# Patient Record
Sex: Male | Born: 1970 | Race: White | Hispanic: No | Marital: Married | Smoking: Never smoker
Health system: Southern US, Community
[De-identification: ages and names within clinical notes are randomized; demographics above are authoritative.]

## PROBLEM LIST (undated history)

## (undated) DIAGNOSIS — N200 Calculus of kidney: Secondary | ICD-10-CM

## (undated) DIAGNOSIS — Z87442 Personal history of urinary calculi: Secondary | ICD-10-CM

## (undated) DIAGNOSIS — G4730 Sleep apnea, unspecified: Secondary | ICD-10-CM

## (undated) HISTORY — PX: APPENDECTOMY: SHX54

## (undated) HISTORY — PX: BACK SURGERY: SHX140

---

## 2000-12-15 ENCOUNTER — Emergency Department (HOSPITAL_COMMUNITY): Admission: EM | Admit: 2000-12-15 | Discharge: 2000-12-15 | Payer: Self-pay | Admitting: Emergency Medicine

## 2001-02-04 ENCOUNTER — Encounter: Payer: Self-pay | Admitting: Neurosurgery

## 2001-02-04 ENCOUNTER — Encounter: Admission: RE | Admit: 2001-02-04 | Discharge: 2001-02-04 | Payer: Self-pay | Admitting: Neurosurgery

## 2001-02-18 ENCOUNTER — Encounter: Payer: Self-pay | Admitting: Neurosurgery

## 2001-02-18 ENCOUNTER — Inpatient Hospital Stay (HOSPITAL_COMMUNITY): Admission: RE | Admit: 2001-02-18 | Discharge: 2001-02-19 | Payer: Self-pay | Admitting: Neurosurgery

## 2002-09-22 ENCOUNTER — Encounter: Payer: Self-pay | Admitting: Neurosurgery

## 2002-09-23 ENCOUNTER — Encounter: Payer: Self-pay | Admitting: Neurosurgery

## 2002-09-23 ENCOUNTER — Inpatient Hospital Stay (HOSPITAL_COMMUNITY): Admission: RE | Admit: 2002-09-23 | Discharge: 2002-09-26 | Payer: Self-pay | Admitting: Neurosurgery

## 2003-01-28 ENCOUNTER — Encounter (HOSPITAL_COMMUNITY): Admission: RE | Admit: 2003-01-28 | Discharge: 2003-02-27 | Payer: Self-pay | Admitting: Neurosurgery

## 2003-03-01 ENCOUNTER — Encounter (HOSPITAL_COMMUNITY): Admission: RE | Admit: 2003-03-01 | Discharge: 2003-03-31 | Payer: Self-pay | Admitting: Neurosurgery

## 2014-08-22 ENCOUNTER — Emergency Department (HOSPITAL_COMMUNITY)
Admission: EM | Admit: 2014-08-22 | Discharge: 2014-08-22 | Disposition: A | Discharge status: Home / Self Care | Payer: BLUE CROSS/BLUE SHIELD | Attending: Emergency Medicine | Admitting: Emergency Medicine

## 2014-08-22 ENCOUNTER — Emergency Department (HOSPITAL_COMMUNITY): Payer: BLUE CROSS/BLUE SHIELD

## 2014-08-22 ENCOUNTER — Encounter (HOSPITAL_COMMUNITY): Payer: Self-pay | Admitting: Emergency Medicine

## 2014-08-22 DIAGNOSIS — N201 Calculus of ureter: Secondary | ICD-10-CM

## 2014-08-22 DIAGNOSIS — N23 Unspecified renal colic: Secondary | ICD-10-CM | POA: Diagnosis not present

## 2014-08-22 DIAGNOSIS — N202 Calculus of kidney with calculus of ureter: Secondary | ICD-10-CM | POA: Insufficient documentation

## 2014-08-22 DIAGNOSIS — R52 Pain, unspecified: Secondary | ICD-10-CM

## 2014-08-22 DIAGNOSIS — Z79899 Other long term (current) drug therapy: Secondary | ICD-10-CM | POA: Diagnosis not present

## 2014-08-22 DIAGNOSIS — N200 Calculus of kidney: Secondary | ICD-10-CM

## 2014-08-22 DIAGNOSIS — R109 Unspecified abdominal pain: Secondary | ICD-10-CM | POA: Diagnosis present

## 2014-08-22 HISTORY — DX: Calculus of kidney: N20.0

## 2014-08-22 LAB — CBC WITH DIFFERENTIAL/PLATELET
Basophils Absolute: 0 10*3/uL (ref 0.0–0.1)
Basophils Relative: 0 % (ref 0–1)
Eosinophils Absolute: 0.1 10*3/uL (ref 0.0–0.7)
Eosinophils Relative: 1 % (ref 0–5)
HCT: 44.1 % (ref 39.0–52.0)
Hemoglobin: 14.8 g/dL (ref 13.0–17.0)
Lymphocytes Relative: 30 % (ref 12–46)
Lymphs Abs: 2.7 10*3/uL (ref 0.7–4.0)
MCH: 30.1 pg (ref 26.0–34.0)
MCHC: 33.6 g/dL (ref 30.0–36.0)
MCV: 89.6 fL (ref 78.0–100.0)
Monocytes Absolute: 0.7 10*3/uL (ref 0.1–1.0)
Monocytes Relative: 8 % (ref 3–12)
Neutro Abs: 5.4 10*3/uL (ref 1.7–7.7)
Neutrophils Relative %: 61 % (ref 43–77)
Platelets: 179 10*3/uL (ref 150–400)
RBC: 4.92 MIL/uL (ref 4.22–5.81)
RDW: 13.2 % (ref 11.5–15.5)
WBC: 8.9 10*3/uL (ref 4.0–10.5)

## 2014-08-22 LAB — BASIC METABOLIC PANEL
ANION GAP: 9 (ref 5–15)
BUN: 27 mg/dL — ABNORMAL HIGH (ref 6–20)
CO2: 26 mmol/L (ref 22–32)
CREATININE: 1.63 mg/dL — AB (ref 0.61–1.24)
Calcium: 9 mg/dL (ref 8.9–10.3)
Chloride: 105 mmol/L (ref 101–111)
GFR calc Af Amer: 57 mL/min — ABNORMAL LOW (ref >60)
GFR calc non Af Amer: 49 mL/min — ABNORMAL LOW (ref >60)
GLUCOSE: 123 mg/dL — AB (ref 65–99)
Potassium: 3.7 mmol/L (ref 3.5–5.1)
Sodium: 140 mmol/L (ref 135–145)

## 2014-08-22 LAB — URINALYSIS, ROUTINE W REFLEX MICROSCOPIC
Glucose, UA: NEGATIVE mg/dL
Ketones, ur: NEGATIVE mg/dL
Leukocytes, UA: NEGATIVE
Nitrite: NEGATIVE
Specific Gravity, Urine: >1.03 — ABNORMAL HIGH (ref 1.005–1.030)
Urobilinogen, UA: 0.2 mg/dL (ref 0.0–1.0)
pH: 5.5 (ref 5.0–8.0)

## 2014-08-22 LAB — URINE MICROSCOPIC-ADD ON

## 2014-08-22 MED ORDER — TAMSULOSIN HCL 0.4 MG PO CAPS: 0.4000 mg | ORAL_CAPSULE | Freq: Every day | ORAL | Status: DC

## 2014-08-22 MED ORDER — HYDROCODONE-ACETAMINOPHEN 5-325 MG PO TABS: 2.0000 | ORAL_TABLET | ORAL | Status: DC | PRN

## 2014-08-22 MED ORDER — HYDROCODONE-ACETAMINOPHEN 5-325 MG PO TABS
1.0000 | ORAL_TABLET | Freq: Four times a day (QID) | ORAL | Status: DC | PRN
Start: 2014-08-22 — End: 2022-04-05

## 2014-08-22 MED ORDER — KETOROLAC TROMETHAMINE 30 MG/ML IJ SOLN
30.0000 mg | Freq: Once | INTRAMUSCULAR | Status: AC
  Administered 2014-08-22: 30 mg via INTRAVENOUS
  Filled 2014-08-22: qty 1

## 2014-08-22 MED ORDER — MORPHINE SULFATE 4 MG/ML IJ SOLN
8.0000 mg | Freq: Once | INTRAMUSCULAR | Status: AC
  Administered 2014-08-22: 8 mg via INTRAVENOUS
  Filled 2014-08-22: qty 2

## 2014-08-22 MED ORDER — OXYCODONE-ACETAMINOPHEN 5-325 MG PO TABS: 2.0000 | ORAL_TABLET | ORAL | Status: DC | PRN

## 2014-08-22 MED ORDER — ONDANSETRON HCL 4 MG/2ML IJ SOLN
4.0000 mg | Freq: Once | INTRAMUSCULAR | Status: AC
  Administered 2014-08-22: 4 mg via INTRAVENOUS
  Filled 2014-08-22: qty 2

## 2014-08-22 NOTE — ED Notes (Signed)
Pt. C/o right flank pain. Pt. Reports nausea. Pt. Reports hx of kidney stones.

## 2014-08-22 NOTE — Discharge Instructions (Signed)

## 2014-08-22 NOTE — ED Provider Notes (Signed)
CSN: [id number]     Arrival date & time 08/22/14  2019 History   First MD Initiated Contact with Patient 08/22/14 2031     Chief Complaint  Patient presents with  . Flank Pain     (Consider location/radiation/quality/duration/timing/severity/associated sxs/prior Treatment) Patient is a 44 y.o. male presenting with flank pain. The history is provided by the patient.  Flank Pain This is a new problem. The current episode started 3 to 5 hours ago. The problem occurs constantly. The problem has been rapidly worsening. Nothing aggravates the symptoms. Nothing relieves the symptoms. He has tried nothing for the symptoms. The treatment provided no relief.    Past Medical History  Diagnosis Date  . Kidney stones    Past Surgical History  Procedure Laterality Date  . Back surgery     No family history on file. History  Substance Use Topics  . Smoking status: Never Smoker   . Smokeless tobacco: Not on file  . Alcohol Use: No    Review of Systems  Genitourinary: Positive for flank pain.  All other systems reviewed and are negative.     Allergies  Review of patient's allergies indicates no known allergies.  Home Medications   Prior to Admission medications   Medication Sig Start Date End Date Taking? Authorizing Provider  ibuprofen (ADVIL,MOTRIN) 200 MG tablet Take 600 mg by mouth every 6 (six) hours as needed for mild pain.   Yes Historical Provider, MD  HYDROcodone-acetaminophen (NORCO/VICODIN) 5-325 MG per tablet Take 1 tablet by mouth every 6 (six) hours as needed for moderate pain. 08/22/14   Lyndal Pulley, MD  tamsulosin (FLOMAX) 0.4 MG CAPS capsule Take 1 capsule (0.4 mg total) by mouth daily. 08/22/14   Lyndal Pulley, MD   BP 144/86 mmHg  Pulse 70  Temp(Src) 97.9 F (36.6 C) (Oral)  Resp 24  Ht  (1.803 m)  Wt 270 lb (122.471 kg)  BMI 37.67 kg/m2  SpO2 96% Physical Exam  Constitutional: He is oriented to person, place, and time. He appears well-developed and  well-nourished. No distress.  HENT:  Head: Normocephalic and atraumatic.  Eyes: Conjunctivae are normal.  Neck: Neck supple. No tracheal deviation present.  Cardiovascular: Normal rate and regular rhythm.   Pulmonary/Chest: Effort normal. No respiratory distress.  Abdominal: Soft. He exhibits no distension and no mass. There is tenderness (in RLQ, right flank, right CVA). There is no rebound and no guarding.  Neurological: He is alert and oriented to person, place, and time.  Skin: Skin is warm and dry.  Psychiatric: He has a normal mood and affect.    ED Course  Procedures (including critical care time) Labs Review Labs Reviewed  URINALYSIS, ROUTINE W REFLEX MICROSCOPIC (NOT AT [facility]) - Abnormal; Notable for the following:    Specific Gravity, Urine >1.030 (*)    Hgb urine dipstick LARGE (*)    Bilirubin Urine SMALL (*)    Protein, ur TRACE (*)    All other components within normal limits  BASIC METABOLIC PANEL - Abnormal; Notable for the following:    Glucose, Bld 123 (*)    BUN 27 (*)    Creatinine, Ser 1.63 (*)    GFR calc non Af Amer 49 (*)    GFR calc Af Amer 57 (*)    All other components within normal limits  CBC WITH DIFFERENTIAL/PLATELET  URINE MICROSCOPIC-ADD ON   Imaging Review Ct Renal Stone Study  08/22/2014   CLINICAL DATA:  Sharp constant right flank pain  and nausea. History of kidney stones.  EXAM: CT ABDOMEN AND PELVIS WITHOUT CONTRAST  TECHNIQUE: Multidetector CT imaging of the abdomen and pelvis was performed following the standard protocol without IV contrast.  COMPARISON:  None.  FINDINGS: Lung bases demonstrate mild linear atelectasis over the lingula.  Abdominal images demonstrate a normal liver, spleen, pancreas, gallbladder and adrenal glands. Appendix is not well visualized.  Kidneys are normal in size. There are no renal stones identified. There is mild dilatation of the right intrarenal collecting system. There is no perinephric inflammation or fluid.  There is a 2-3 mm stone just inside the bladder at the right UVJ causing low-grade obstruction. Left ureter is normal.  Colon and small bowel are within normal. Mesentery is normal. There is no free fluid.  Abdominal aorta is normal.  Remaining pelvic images demonstrate a normal prostate and rectum. There mild degenerate changes of the spine. Intervertebral cages present at the L4-5 level.  IMPRESSION: 2-3 mm stone just inside the bladder at the right UVJ causing low-grade obstruction.   Electronically Signed   By: Elberta Fortis M.D.   On: 08/22/2014 21:55   I independently viewed and interpreted the above radiology studies and agree with radiologist report.    EKG Interpretation None      MDM   Final diagnoses:  Kidney stone on right side  Renal colic on right side  Obstruction of right ureteropelvic junction due to stone    44 year old male presents with 2 hours of sudden onset right flank pain radiating to the right groin that he has had previously and self diagnosed with kidney stones that usually resolve spontaneously by passing. Today his pain is much worse than his typical episode and he was incapacitated. He has never had prior imaging to evaluate for stones. He does have a slight elevation in his creatinine but stone in his distal and he is not bilaterally obstructed or infected. Single 3 mm distal stone noted on CT unaffected side. I recommended follow-up with urology as needed and provided pain relief and expulsion therapy for home.    Lyndal Pulley, MD 08/22/14 2213

## 2015-12-19 DIAGNOSIS — R7301 Impaired fasting glucose: Secondary | ICD-10-CM | POA: Diagnosis not present

## 2015-12-19 DIAGNOSIS — R229 Localized swelling, mass and lump, unspecified: Secondary | ICD-10-CM | POA: Diagnosis not present

## 2016-03-19 DIAGNOSIS — R7301 Impaired fasting glucose: Secondary | ICD-10-CM | POA: Diagnosis not present

## 2016-03-19 DIAGNOSIS — R030 Elevated blood-pressure reading, without diagnosis of hypertension: Secondary | ICD-10-CM | POA: Diagnosis not present

## 2016-03-19 DIAGNOSIS — Z008 Encounter for other general examination: Secondary | ICD-10-CM | POA: Diagnosis not present

## 2016-04-06 IMAGING — CT CT RENAL STONE PROTOCOL
2 of 4 series · 17 of 46 positions shown, 19 images · non-contrast
Comparison: None.

CLINICAL DATA: Sharp constant right flank pain and nausea. History
of kidney stones.

EXAM:
CT ABDOMEN AND PELVIS WITHOUT CONTRAST
TECHNIQUE: Multidetector CT imaging of the abdomen and pelvis was performed
following the standard protocol without IV contrast.

[Series 2: standard/full over (age)lbs 5.0 · axial · 0.75mm/px · z∈[+390,+885]mm · 14 of 109 slices shown, 16 images]
[im 5/109  soft-tissue]
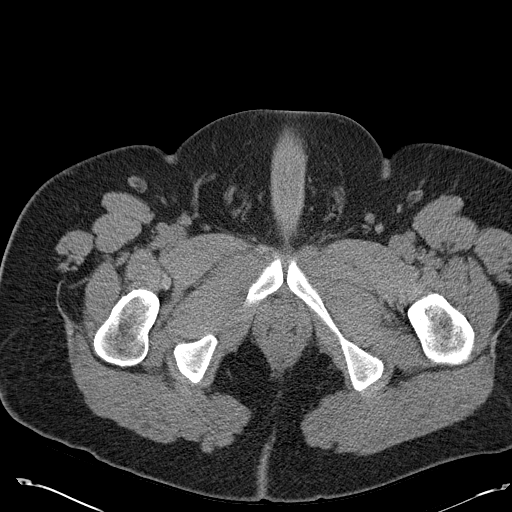
[im 5/109  bone]
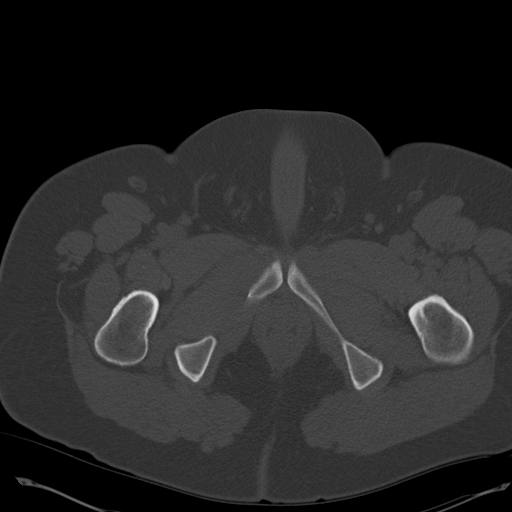
[im 15/109  soft-tissue]
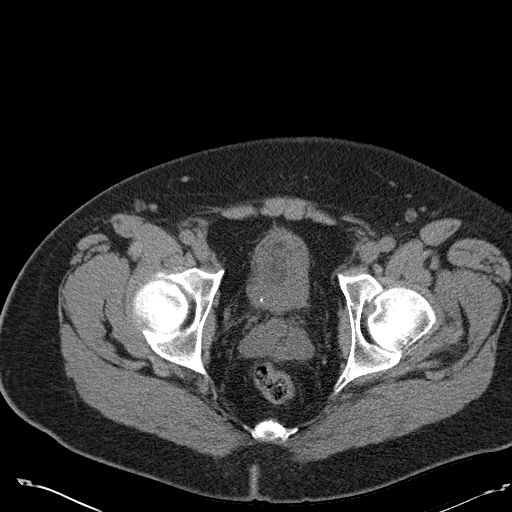
[im 19/109  soft-tissue]
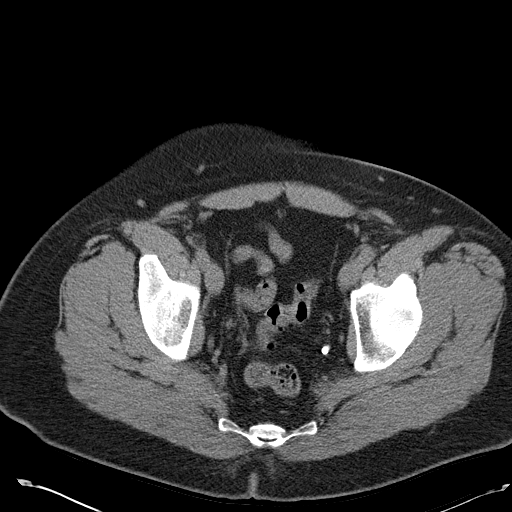
[im 29/109  soft-tissue]
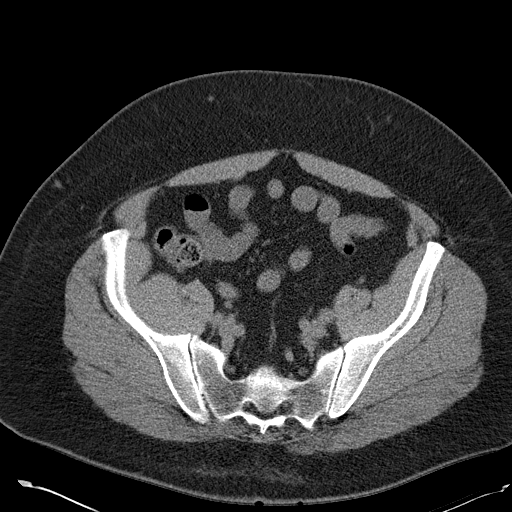
[im 38/109  soft-tissue]
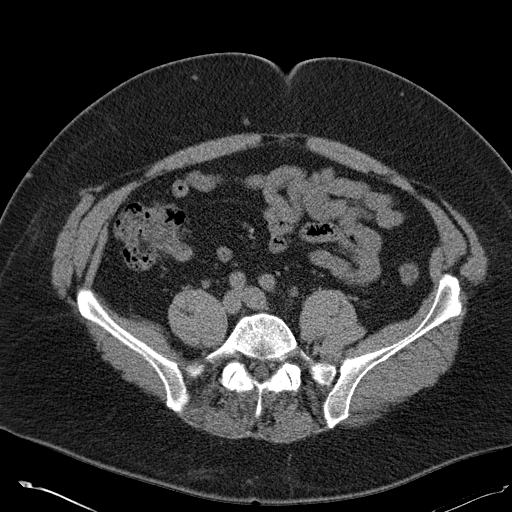
[im 43/109  soft-tissue]
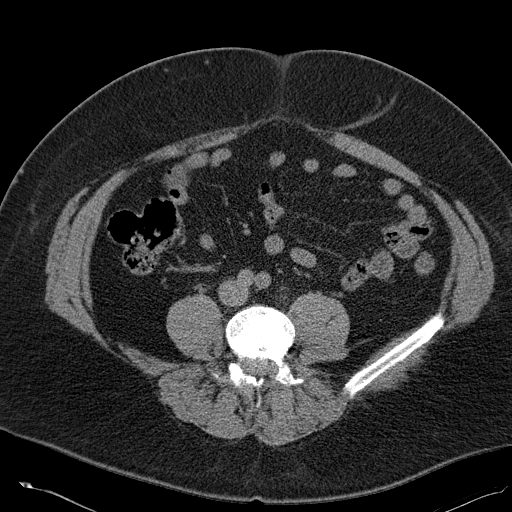
[im 52/109  soft-tissue]
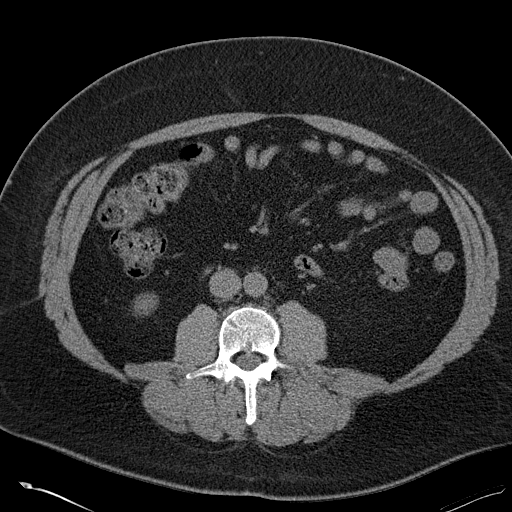
[im 57/109  soft-tissue]
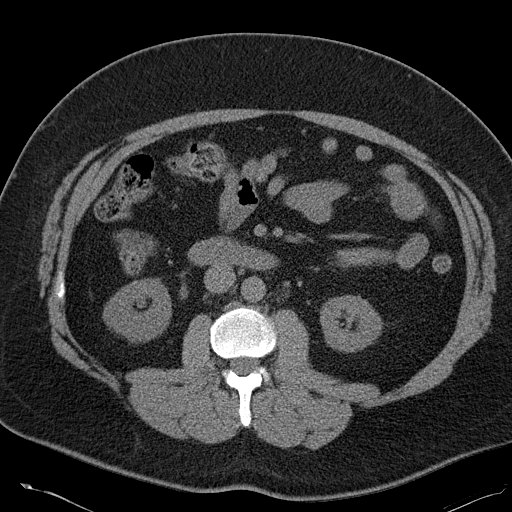
[im 66/109  soft-tissue]
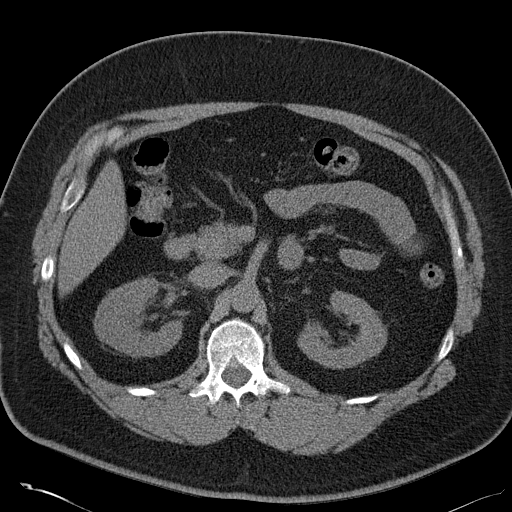
[im 66/109  bone]
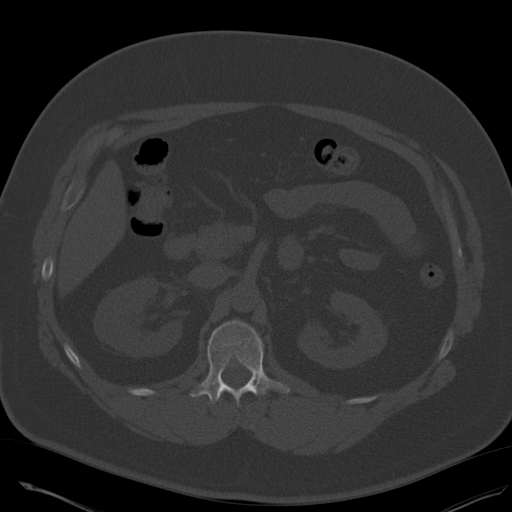
[im 71/109  soft-tissue]
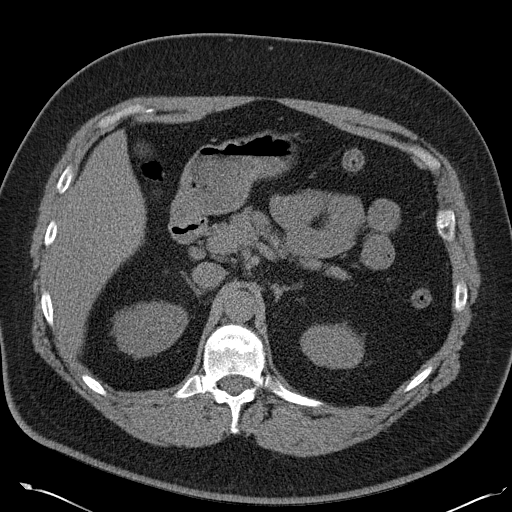
[im 80/109  soft-tissue]
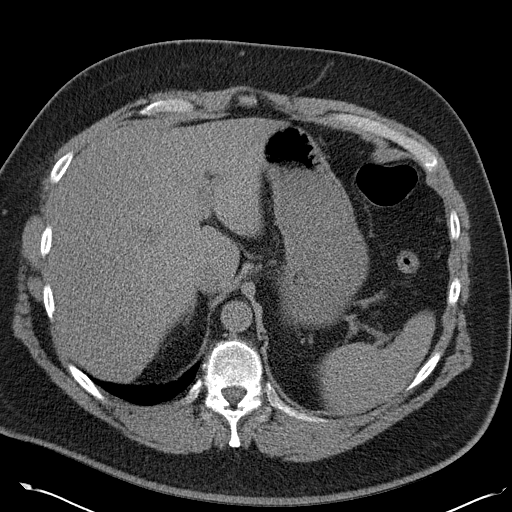
[im 90/109  soft-tissue]
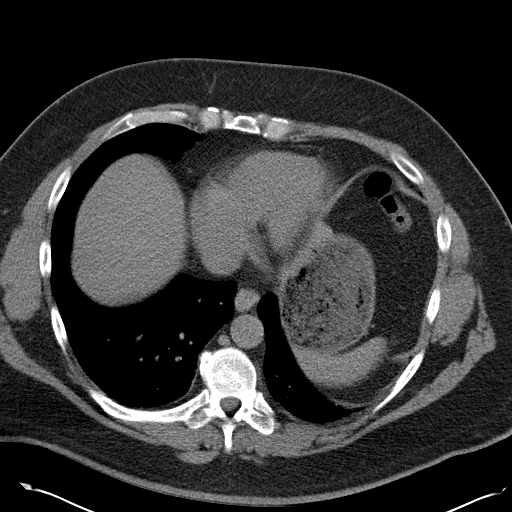
[im 94/109  soft-tissue]
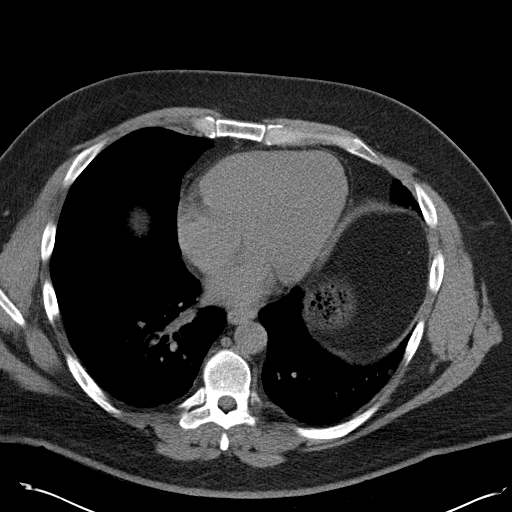
[im 104/109  soft-tissue]
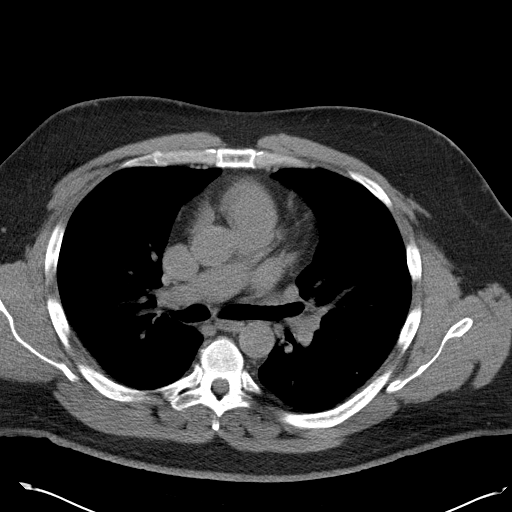

[Series 3: mpr coronal · coronal · 0.95mm/px · 3 of 129 slices shown]
[im 43/129  soft-tissue]
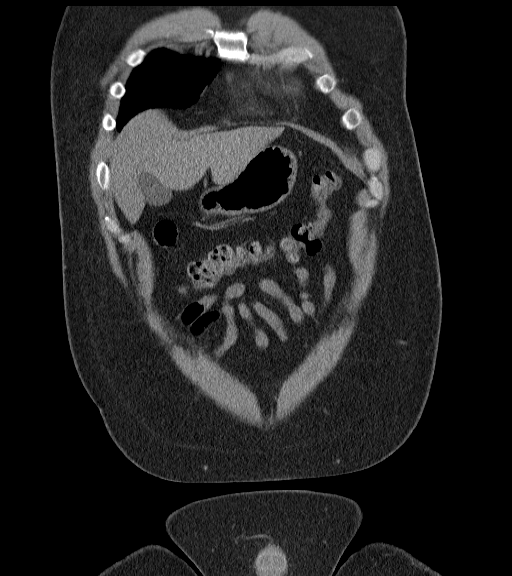
[im 57/129  soft-tissue]
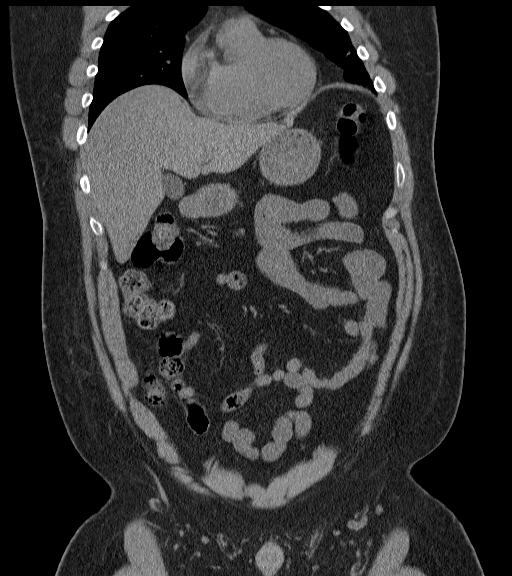
[im 72/129  soft-tissue]
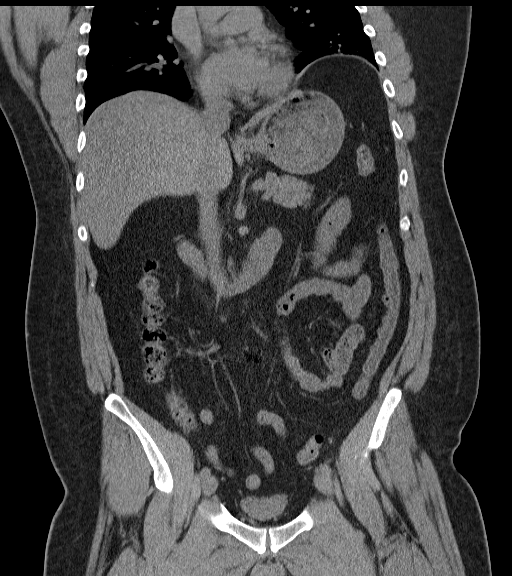

[17 of 46 positions shown; findings below may reference images not displayed]

FINDINGS: Lung bases demonstrate mild linear atelectasis over the lingula.

Abdominal images demonstrate a normal liver, spleen, pancreas,
gallbladder and adrenal glands. Appendix is not well visualized.

Kidneys are normal in size. There are no renal stones identified.
There is mild dilatation of the right intrarenal collecting system.
There is no perinephric inflammation or fluid. There is a 2-3 mm
stone just inside the bladder at the right UVJ causing low-grade
obstruction. Left ureter is normal.

Colon and small bowel are within normal. Mesentery is normal. There
is no free fluid.

Abdominal aorta is normal.

Remaining pelvic images demonstrate a normal prostate and rectum.
There mild degenerate changes of the spine. Intervertebral cages
present at the L4-5 level.
IMPRESSION: 2-3 mm stone just inside the bladder at the right UVJ causing
low-grade obstruction.

## 2016-06-11 DIAGNOSIS — Z008 Encounter for other general examination: Secondary | ICD-10-CM | POA: Diagnosis not present

## 2016-08-25 DIAGNOSIS — Z6841 Body Mass Index (BMI) 40.0 and over, adult: Secondary | ICD-10-CM | POA: Diagnosis not present

## 2016-08-25 DIAGNOSIS — J0100 Acute maxillary sinusitis, unspecified: Secondary | ICD-10-CM | POA: Diagnosis not present

## 2016-08-31 DIAGNOSIS — Z008 Encounter for other general examination: Secondary | ICD-10-CM | POA: Diagnosis not present

## 2016-08-31 DIAGNOSIS — R7301 Impaired fasting glucose: Secondary | ICD-10-CM | POA: Diagnosis not present

## 2016-11-03 DIAGNOSIS — J019 Acute sinusitis, unspecified: Secondary | ICD-10-CM | POA: Diagnosis not present

## 2016-11-03 DIAGNOSIS — R05 Cough: Secondary | ICD-10-CM | POA: Diagnosis not present

## 2016-11-03 DIAGNOSIS — Z6841 Body Mass Index (BMI) 40.0 and over, adult: Secondary | ICD-10-CM | POA: Diagnosis not present

## 2016-12-03 DIAGNOSIS — R739 Hyperglycemia, unspecified: Secondary | ICD-10-CM | POA: Diagnosis not present

## 2016-12-03 DIAGNOSIS — Z008 Encounter for other general examination: Secondary | ICD-10-CM | POA: Diagnosis not present

## 2017-06-10 DIAGNOSIS — M79642 Pain in left hand: Secondary | ICD-10-CM | POA: Diagnosis not present

## 2017-06-10 DIAGNOSIS — Z008 Encounter for other general examination: Secondary | ICD-10-CM | POA: Diagnosis not present

## 2017-06-10 DIAGNOSIS — R7301 Impaired fasting glucose: Secondary | ICD-10-CM | POA: Diagnosis not present

## 2017-09-30 DIAGNOSIS — Z008 Encounter for other general examination: Secondary | ICD-10-CM | POA: Diagnosis not present

## 2017-12-30 DIAGNOSIS — Z008 Encounter for other general examination: Secondary | ICD-10-CM | POA: Diagnosis not present

## 2018-03-31 DIAGNOSIS — Z008 Encounter for other general examination: Secondary | ICD-10-CM | POA: Diagnosis not present

## 2018-03-31 DIAGNOSIS — H10022 Other mucopurulent conjunctivitis, left eye: Secondary | ICD-10-CM | POA: Diagnosis not present

## 2018-03-31 DIAGNOSIS — R7301 Impaired fasting glucose: Secondary | ICD-10-CM | POA: Diagnosis not present

## 2018-07-25 DIAGNOSIS — R5383 Other fatigue: Secondary | ICD-10-CM | POA: Diagnosis not present

## 2018-07-25 DIAGNOSIS — R0683 Snoring: Secondary | ICD-10-CM | POA: Diagnosis not present

## 2018-07-25 DIAGNOSIS — H109 Unspecified conjunctivitis: Secondary | ICD-10-CM | POA: Diagnosis not present

## 2018-07-25 DIAGNOSIS — Z6841 Body Mass Index (BMI) 40.0 and over, adult: Secondary | ICD-10-CM | POA: Diagnosis not present

## 2018-08-30 DIAGNOSIS — G4733 Obstructive sleep apnea (adult) (pediatric): Secondary | ICD-10-CM | POA: Diagnosis not present

## 2018-08-30 DIAGNOSIS — R0683 Snoring: Secondary | ICD-10-CM | POA: Diagnosis not present

## 2018-09-05 DIAGNOSIS — H04123 Dry eye syndrome of bilateral lacrimal glands: Secondary | ICD-10-CM | POA: Diagnosis not present

## 2018-10-10 DIAGNOSIS — G4733 Obstructive sleep apnea (adult) (pediatric): Secondary | ICD-10-CM | POA: Diagnosis not present

## 2018-11-09 DIAGNOSIS — G4733 Obstructive sleep apnea (adult) (pediatric): Secondary | ICD-10-CM | POA: Diagnosis not present

## 2018-12-08 DIAGNOSIS — Z008 Encounter for other general examination: Secondary | ICD-10-CM | POA: Diagnosis not present

## 2018-12-10 DIAGNOSIS — G4733 Obstructive sleep apnea (adult) (pediatric): Secondary | ICD-10-CM | POA: Diagnosis not present

## 2019-01-09 DIAGNOSIS — G4733 Obstructive sleep apnea (adult) (pediatric): Secondary | ICD-10-CM | POA: Diagnosis not present

## 2019-02-09 DIAGNOSIS — G4733 Obstructive sleep apnea (adult) (pediatric): Secondary | ICD-10-CM | POA: Diagnosis not present

## 2019-03-12 DIAGNOSIS — G4733 Obstructive sleep apnea (adult) (pediatric): Secondary | ICD-10-CM | POA: Diagnosis not present

## 2019-04-09 DIAGNOSIS — G4733 Obstructive sleep apnea (adult) (pediatric): Secondary | ICD-10-CM | POA: Diagnosis not present

## 2019-04-10 ENCOUNTER — Ambulatory Visit: Payer: BLUE CROSS/BLUE SHIELD

## 2019-05-10 DIAGNOSIS — G4733 Obstructive sleep apnea (adult) (pediatric): Secondary | ICD-10-CM | POA: Diagnosis not present

## 2019-06-09 DIAGNOSIS — G4733 Obstructive sleep apnea (adult) (pediatric): Secondary | ICD-10-CM | POA: Diagnosis not present

## 2019-07-10 DIAGNOSIS — G4733 Obstructive sleep apnea (adult) (pediatric): Secondary | ICD-10-CM | POA: Diagnosis not present

## 2019-08-09 DIAGNOSIS — G4733 Obstructive sleep apnea (adult) (pediatric): Secondary | ICD-10-CM | POA: Diagnosis not present

## 2019-09-09 DIAGNOSIS — G4733 Obstructive sleep apnea (adult) (pediatric): Secondary | ICD-10-CM | POA: Diagnosis not present

## 2019-09-29 DIAGNOSIS — G4733 Obstructive sleep apnea (adult) (pediatric): Secondary | ICD-10-CM | POA: Diagnosis not present

## 2019-10-10 DIAGNOSIS — G4733 Obstructive sleep apnea (adult) (pediatric): Secondary | ICD-10-CM | POA: Diagnosis not present

## 2019-11-09 DIAGNOSIS — G4733 Obstructive sleep apnea (adult) (pediatric): Secondary | ICD-10-CM | POA: Diagnosis not present

## 2019-12-10 DIAGNOSIS — G4733 Obstructive sleep apnea (adult) (pediatric): Secondary | ICD-10-CM | POA: Diagnosis not present

## 2020-01-09 DIAGNOSIS — G4733 Obstructive sleep apnea (adult) (pediatric): Secondary | ICD-10-CM | POA: Diagnosis not present

## 2022-03-30 ENCOUNTER — Other Ambulatory Visit: Payer: Self-pay | Admitting: *Deleted

## 2022-03-30 DIAGNOSIS — K429 Umbilical hernia without obstruction or gangrene: Secondary | ICD-10-CM

## 2022-04-05 ENCOUNTER — Encounter: Payer: Self-pay | Admitting: General Surgery

## 2022-04-05 ENCOUNTER — Ambulatory Visit: Payer: BC Managed Care – PPO | Admitting: General Surgery

## 2022-04-05 VITALS — BP 166/96 | HR 60 | Temp 98.1°F | Resp 14 | Ht 71.0 in | Wt 307.0 lb

## 2022-04-05 DIAGNOSIS — K429 Umbilical hernia without obstruction or gangrene: Secondary | ICD-10-CM

## 2022-04-05 NOTE — Progress Notes (Signed)
Jack Long; [id number]; Apr 12, 1970   HPI Patient is a 52 year old white male who was referred to my care by Dr. Quintin Alto for evaluation and treatment of an umbilical hernia.  Patient has had an umbilical hernia for some time, but over the past few months, it has increased in size and is now causing him discomfort.  He does have migratory abdominal wall pain with straining.  He denies any nausea or vomiting. Past Medical History:  Diagnosis Date   Kidney stones     Past Surgical History:  Procedure Laterality Date   BACK SURGERY      History reviewed. No pertinent family history.  Current Outpatient Medications on File Prior to Visit  Medication Sig Dispense Refill   methylPREDNISolone (MEDROL DOSEPAK) 4 MG TBPK tablet Take 8 mg by mouth as directed. (Patient not taking: Reported on 04/05/2022)     No current facility-administered medications on file prior to visit.    No Known Allergies  Social History   Substance and Sexual Activity  Alcohol Use No    Social History   Tobacco Use  Smoking Status Never  Smokeless Tobacco Not on file    Review of Systems  Constitutional: Negative.   HENT: Negative.    Eyes: Negative.   Respiratory: Negative.    Cardiovascular: Negative.   Gastrointestinal:  Positive for abdominal pain.  Genitourinary: Negative.   Musculoskeletal: Negative.   Skin: Negative.   Neurological: Negative.   Endo/Heme/Allergies: Negative.   Psychiatric/Behavioral: Negative.      Objective   Vitals:   04/05/22 0933  BP: (!) 166/96  Pulse: 60  Resp: 14  Temp: 98.1 F (36.7 C)  SpO2: 94%    Physical Exam Vitals reviewed.  Constitutional:      Appearance: Normal appearance. He is obese. He is not ill-appearing.  HENT:     Head: Normocephalic and atraumatic.  Cardiovascular:     Rate and Rhythm: Normal rate and regular rhythm.     Heart sounds: Normal heart sounds. No murmur heard.    No friction rub. No gallop.  Pulmonary:     Effort:  Pulmonary effort is normal. No respiratory distress.     Breath sounds: Normal breath sounds. No stridor. No wheezing, rhonchi or rales.  Abdominal:     General: Bowel sounds are normal. There is no distension.     Palpations: Abdomen is soft. There is no mass.     Tenderness: There is no abdominal tenderness. There is no guarding or rebound.     Hernia: A hernia is present.     Comments: Large 6 cm umbilical hernia which is partially reducible.  This appears to contain omentum.  This does cause him discomfort when trying to reduce it.  No inguinal hernias identified.  Skin:    General: Skin is warm and dry.  Neurological:     Mental Status: He is alert and oriented to person, place, and time.     Assessment  Umbilical hernia, symptomatic Plan  Patient is scheduled for robotic assisted laparoscopic umbilical herniorrhaphy with mesh on 04/19/2022.  The risks and benefits of the procedure including bleeding, infection, mesh use, and the possibility of recurrence of the hernia were fully explained to the patient, who gave informed consent.

## 2022-04-06 NOTE — H&P (Signed)
Jack Long; [id number]; 11/03/1970   HPI Patient is a 52-year-old white male who was referred to my care by Dr. Sasser for evaluation and treatment of an umbilical hernia.  Patient has had an umbilical hernia for some time, but over the past few months, it has increased in size and is now causing him discomfort.  He does have migratory abdominal wall pain with straining.  He denies any nausea or vomiting. Past Medical History:  Diagnosis Date   Kidney stones     Past Surgical History:  Procedure Laterality Date   BACK SURGERY      History reviewed. No pertinent family history.  Current Outpatient Medications on File Prior to Visit  Medication Sig Dispense Refill   methylPREDNISolone (MEDROL DOSEPAK) 4 MG TBPK tablet Take 8 mg by mouth as directed. (Patient not taking: Reported on 04/05/2022)     No current facility-administered medications on file prior to visit.    No Known Allergies  Social History   Substance and Sexual Activity  Alcohol Use No    Social History   Tobacco Use  Smoking Status Never  Smokeless Tobacco Not on file    Review of Systems  Constitutional: Negative.   HENT: Negative.    Eyes: Negative.   Respiratory: Negative.    Cardiovascular: Negative.   Gastrointestinal:  Positive for abdominal pain.  Genitourinary: Negative.   Musculoskeletal: Negative.   Skin: Negative.   Neurological: Negative.   Endo/Heme/Allergies: Negative.   Psychiatric/Behavioral: Negative.      Objective   Vitals:   04/05/22 0933  BP: (!) 166/96  Pulse: 60  Resp: 14  Temp: 98.1 F (36.7 C)  SpO2: 94%    Physical Exam Vitals reviewed.  Constitutional:      Appearance: Normal appearance. He is obese. He is not ill-appearing.  HENT:     Head: Normocephalic and atraumatic.  Cardiovascular:     Rate and Rhythm: Normal rate and regular rhythm.     Heart sounds: Normal heart sounds. No murmur heard.    No friction rub. No gallop.  Pulmonary:     Effort:  Pulmonary effort is normal. No respiratory distress.     Breath sounds: Normal breath sounds. No stridor. No wheezing, rhonchi or rales.  Abdominal:     General: Bowel sounds are normal. There is no distension.     Palpations: Abdomen is soft. There is no mass.     Tenderness: There is no abdominal tenderness. There is no guarding or rebound.     Hernia: A hernia is present.     Comments: Large 6 cm umbilical hernia which is partially reducible.  This appears to contain omentum.  This does cause him discomfort when trying to reduce it.  No inguinal hernias identified.  Skin:    General: Skin is warm and dry.  Neurological:     Mental Status: He is alert and oriented to person, place, and time.     Assessment  Umbilical hernia, symptomatic Plan  Patient is scheduled for robotic assisted laparoscopic umbilical herniorrhaphy with mesh on 04/19/2022.  The risks and benefits of the procedure including bleeding, infection, mesh use, and the possibility of recurrence of the hernia were fully explained to the patient, who gave informed consent. 

## 2022-04-12 ENCOUNTER — Telehealth: Payer: Self-pay | Admitting: *Deleted

## 2022-04-12 NOTE — Telephone Encounter (Signed)
Surgical Date: 04/19/2022 Procedure: XI Robotic Assisted Umbilical Hernia Repair W/ Mesh  Received call from patient [phone]~ telephone.   Patient requested note be sent to HR for notification for upcoming surgery:   Janan Halter, Medical Department [phone] fax

## 2022-04-12 NOTE — Patient Instructions (Signed)
Holton Hemani Haymond  04/12/2022     @PREFPERIOPPHARMACY @   Your procedure is scheduled on  04/19/2022.   Report to [facility] at  0900  A.M.   Call this number if you have problems the morning of surgery:  [phone]  If you experience any cold or flu symptoms such as cough, fever, chills, shortness of breath, etc. between now and your scheduled surgery, please notify us at the above number.   Remember:  Do not eat or drink after midnight.      Take these medicines the morning of surgery with A SIP OF WATER                                        None.     Do not wear jewelry, make-up or nail polish.  Do not wear lotions, powders, or perfumes, or deodorant.  Do not shave 48 hours prior to surgery.  Men may shave face and neck.  Do not bring valuables to the hospital.  [facility] is not responsible for any belongings or valuables.  Contacts, dentures or bridgework may not be worn into surgery.  Leave your suitcase in the car.  After surgery it may be brought to your room.  For patients admitted to the hospital, discharge time will be determined by your treatment team.  Patients discharged the day of surgery will not be allowed to drive home and must have someone with them for 24 hours.    Special instructions:   DO NOT smoke tobacco or vape for 24 hours before your procedure.  Please read over the following fact sheets that you were given. Pain Booklet, Coughing and Deep Breathing, Surgical Site Infection Prevention, Anesthesia Post-op Instructions, and Care and Recovery After Surgery         Laparoscopic Ventral Hernia Repair, Care After The following information offers guidance on how to care for yourself after your procedure. Your health care provider may also give you more specific instructions. If you have problems or questions, contact your health care provider. What can I expect after the procedure? After the procedure, it is common to have pain,  discomfort, or soreness. Follow these instructions at home: Medicines Take over-the-counter and prescription medicines only as told by your health care provider. Ask your health care provider if the medicine prescribed to you: Requires you to avoid driving or using machinery. Can cause constipation. You may need to take these actions to prevent or treat constipation: Drink enough fluid to keep your urine pale yellow. Take over-the-counter or prescription medicines. Eat foods that are high in fiber, such as beans, whole grains, and fresh fruits and vegetables. Limit foods that are high in fat and processed sugars, such as fried or sweet foods. Incision care  Follow instructions from your health care provider about how to take care of your incisions. Make sure you: Wash your hands with soap and water for at least 20 seconds before and after you change your bandage (dressing) or before you touch your abdomen. If soap and water are not available, use hand sanitizer. Change your dressing as told by your health care provider. Leave stitches (sutures), skin glue, or adhesive strips in place. These skin closures may need to stay in place for 2 weeks or longer. If adhesive strip edges start to loosen and curl up, you may trim the loose edges.  Do not remove adhesive strips completely unless your health care provider tells you to do that. Check your incision areas every day for signs of infection. Check for: More redness, swelling, or pain. Fluid or blood. Warmth. Pus or a bad smell. Bathing  Do not take baths, swim, or use a hot tub until your health care provider approves. Ask your health care provider if you may take showers. You may only be allowed to take sponge baths. Keep your dressing dry until your health care provider says it can be removed. Activity  Rest as told by your health care provider. Avoid sitting for a long time without moving. Get up to take short walks every 1-2 hours. This  is important to improve blood flow and breathing. Ask for help if you feel weak or unsteady. Do not lift anything that is heavier than 10 lb (4.5 kg), or the limit that you are told, until your health care provider says that it is safe. If you were given a sedative during the procedure, it can affect you for several hours. Do not drive or operate machinery until your health care provider says that it is safe. Return to your normal activities as told by your health care provider. Ask your health care provider what activities are safe for you. General instructions  Hold a pillow over your abdomen when you cough or sneeze. This helps with pain. Do not use any products that contain nicotine or tobacco. These products include cigarettes, chewing tobacco, and vaping devices, such as e-cigarettes. These can delay healing after surgery. If you need help quitting, ask your health care provider. You may be asked to continue to do deep breathing exercises at home. This will help to prevent a lung infection. Keep all follow-up visits. This is important. Contact a health care provider if: You have any of these signs of infection: More redness, swelling, or pain around an incision. Fluid or blood coming from an incision. Warmth coming from an incision. Pus or a bad smell coming from an incision. A fever or chills. You have pain that gets worse or does not get better with medicine. You have nausea or vomiting. You have a cough. You have shortness of breath. You have not had a bowel movement in 3 days. You are not able to urinate. Get help right away if you have: Severe pain in your abdomen. Persistent nausea and vomiting. Redness, warmth, or pain in your leg. Chest pain. Trouble breathing. These symptoms may represent a serious problem that is an emergency. Do not wait to see if the symptoms will go away. Get medical help right away. Call your local emergency services (911 in the [place] Do not drive  yourself to the hospital. Summary After this procedure, it is common to have pain, discomfort, or soreness. Follow instructions from your health care provider about how to take care of your incision. Check your incision area every day for signs of infection. Report any signs of infection to your health care provider. Keep all follow-up visits. This is important. This information is not intended to replace advice given to you by your health care provider. Make sure you discuss any questions you have with your health care provider. Document Revised: 08/28/2019 Document Reviewed: 08/28/2019 Elsevier Patient Education  [place] Anesthesia, Adult, Care After The following information offers guidance on how to care for yourself after your procedure. Your health care provider may also give you more specific instructions. If you have problems or  questions, contact your health care provider. What can I expect after the procedure? After the procedure, it is common for people to: Have pain or discomfort at the IV site. Have nausea or vomiting. Have a sore throat or hoarseness. Have trouble concentrating. Feel cold or chills. Feel weak, sleepy, or tired (fatigue). Have soreness and body aches. These can affect parts of the body that were not involved in surgery. Follow these instructions at home: For the time period you were told by your health care provider:  Rest. Do not participate in activities where you could fall or become injured. Do not drive or use machinery. Do not drink alcohol. Do not take sleeping pills or medicines that cause drowsiness. Do not make important decisions or sign legal documents. Do not take care of children on your own. General instructions Drink enough fluid to keep your urine pale yellow. If you have sleep apnea, surgery and certain medicines can increase your risk for breathing problems. Follow instructions from your health care provider about  wearing your sleep device: Anytime you are sleeping, including during daytime naps. While taking prescription pain medicines, sleeping medicines, or medicines that make you drowsy. Return to your normal activities as told by your health care provider. Ask your health care provider what activities are safe for you. Take over-the-counter and prescription medicines only as told by your health care provider. Do not use any products that contain nicotine or tobacco. These products include cigarettes, chewing tobacco, and vaping devices, such as e-cigarettes. These can delay incision healing after surgery. If you need help quitting, ask your health care provider. Contact a health care provider if: You have nausea or vomiting that does not get better with medicine. You vomit every time you eat or drink. You have pain that does not get better with medicine. You cannot urinate or have bloody urine. You develop a skin rash. You have a fever. Get help right away if: You have trouble breathing. You have chest pain. You vomit blood. These symptoms may be an emergency. Get help right away. Call 911. Do not wait to see if the symptoms will go away. Do not drive yourself to the hospital. Summary After the procedure, it is common to have a sore throat, hoarseness, nausea, vomiting, or to feel weak, sleepy, or fatigue. For the time period you were told by your health care provider, do not drive or use machinery. Get help right away if you have difficulty breathing, have chest pain, or vomit blood. These symptoms may be an emergency. This information is not intended to replace advice given to you by your health care provider. Make sure you discuss any questions you have with your health care provider. Document Revised: 04/07/2021 Document Reviewed: 04/07/2021 Elsevier Patient Education  [place]. How to Use Chlorhexidine Before Surgery Chlorhexidine gluconate (CHG) is a germ-killing (antiseptic)  solution that is used to clean the skin. It can get rid of the bacteria that normally live on the skin and can keep them away for about 24 hours. To clean your skin with CHG, you may be given: A CHG solution to use in the shower or as part of a sponge bath. A prepackaged cloth that contains CHG. Cleaning your skin with CHG may help lower the risk for infection: While you are staying in the intensive care unit of the hospital. If you have a vascular access, such as a central line, to provide short-term or long-term access to your veins. If you have a  catheter to drain urine from your bladder. If you are on a ventilator. A ventilator is a machine that helps you breathe by moving air in and out of your lungs. After surgery. What are the risks? Risks of using CHG include: A skin reaction. Hearing loss, if CHG gets in your ears and you have a perforated eardrum. Eye injury, if CHG gets in your eyes and is not rinsed out. The CHG product catching fire. Make sure that you avoid smoking and flames after applying CHG to your skin. Do not use CHG: If you have a chlorhexidine allergy or have previously reacted to chlorhexidine. On babies younger than 56 months of age. How to use CHG solution Use CHG only as told by your health care provider, and follow the instructions on the label. Use the full amount of CHG as directed. Usually, this is one bottle. During a shower Follow these steps when using CHG solution during a shower (unless your health care provider gives you different instructions): Start the shower. Use your normal soap and shampoo to wash your face and hair. Turn off the shower or move out of the shower stream. Pour the CHG onto a clean washcloth. Do not use any type of brush or rough-edged sponge. Starting at your neck, lather your body down to your toes. Make sure you follow these instructions: If you will be having surgery, pay special attention to the part of your body where you will  be having surgery. Scrub this area for at least 1 minute. Do not use CHG on your head or face. If the solution gets into your ears or eyes, rinse them well with water. Avoid your genital area. Avoid any areas of skin that have broken skin, cuts, or scrapes. Scrub your back and under your arms. Make sure to wash skin folds. Let the lather sit on your skin for 1-2 minutes or as long as told by your health care provider. Thoroughly rinse your entire body in the shower. Make sure that all body creases and crevices are rinsed well. Dry off with a clean towel. Do not put any substances on your body afterward--such as powder, lotion, or perfume--unless you are told to do so by your health care provider. Only use lotions that are recommended by the manufacturer. Put on clean clothes or pajamas. If it is the night before your surgery, sleep in clean sheets.  During a sponge bath Follow these steps when using CHG solution during a sponge bath (unless your health care provider gives you different instructions): Use your normal soap and shampoo to wash your face and hair. Pour the CHG onto a clean washcloth. Starting at your neck, lather your body down to your toes. Make sure you follow these instructions: If you will be having surgery, pay special attention to the part of your body where you will be having surgery. Scrub this area for at least 1 minute. Do not use CHG on your head or face. If the solution gets into your ears or eyes, rinse them well with water. Avoid your genital area. Avoid any areas of skin that have broken skin, cuts, or scrapes. Scrub your back and under your arms. Make sure to wash skin folds. Let the lather sit on your skin for 1-2 minutes or as long as told by your health care provider. Using a different clean, wet washcloth, thoroughly rinse your entire body. Make sure that all body creases and crevices are rinsed well. Dry off with a clean  towel. Do not put any substances on  your body afterward--such as powder, lotion, or perfume--unless you are told to do so by your health care provider. Only use lotions that are recommended by the manufacturer. Put on clean clothes or pajamas. If it is the night before your surgery, sleep in clean sheets. How to use CHG prepackaged cloths Only use CHG cloths as told by your health care provider, and follow the instructions on the label. Use the CHG cloth on clean, dry skin. Do not use the CHG cloth on your head or face unless your health care provider tells you to. When washing with the CHG cloth: Avoid your genital area. Avoid any areas of skin that have broken skin, cuts, or scrapes. Before surgery Follow these steps when using a CHG cloth to clean before surgery (unless your health care provider gives you different instructions): Using the CHG cloth, vigorously scrub the part of your body where you will be having surgery. Scrub using a back-and-forth motion for 3 minutes. The area on your body should be completely wet with CHG when you are done scrubbing. Do not rinse. Discard the cloth and let the area air-dry. Do not put any substances on the area afterward, such as powder, lotion, or perfume. Put on clean clothes or pajamas. If it is the night before your surgery, sleep in clean sheets.  For general bathing Follow these steps when using CHG cloths for general bathing (unless your health care provider gives you different instructions). Use a separate CHG cloth for each area of your body. Make sure you wash between any folds of skin and between your fingers and toes. Wash your body in the following order, switching to a new cloth after each step: The front of your neck, shoulders, and chest. Both of your arms, under your arms, and your hands. Your stomach and groin area, avoiding the genitals. Your right leg and foot. Your left leg and foot. The back of your neck, your back, and your buttocks. Do not rinse. Discard the  cloth and let the area air-dry. Do not put any substances on your body afterward--such as powder, lotion, or perfume--unless you are told to do so by your health care provider. Only use lotions that are recommended by the manufacturer. Put on clean clothes or pajamas. Contact a health care provider if: Your skin gets irritated after scrubbing. You have questions about using your solution or cloth. You swallow any chlorhexidine. Call your local poison control center (1-(208) 367-3108 in the [place] Get help right away if: Your eyes itch badly, or they become very red or swollen. Your skin itches badly and is red or swollen. Your hearing changes. You have trouble seeing. You have swelling or tingling in your mouth or throat. You have trouble breathing. These symptoms may represent a serious problem that is an emergency. Do not wait to see if the symptoms will go away. Get medical help right away. Call your local emergency services (911 in the [place] Do not drive yourself to the hospital. Summary Chlorhexidine gluconate (CHG) is a germ-killing (antiseptic) solution that is used to clean the skin. Cleaning your skin with CHG may help to lower your risk for infection. You may be given CHG to use for bathing. It may be in a bottle or in a prepackaged cloth to use on your skin. Carefully follow your health care provider's instructions and the instructions on the product label. Do not use CHG if you have a chlorhexidine allergy. Contact  your health care provider if your skin gets irritated after scrubbing. This information is not intended to replace advice given to you by your health care provider. Make sure you discuss any questions you have with your health care provider. Document Revised: 05/08/2021 Document Reviewed: 03/21/2020 Elsevier Patient Education  [place].

## 2022-04-17 ENCOUNTER — Encounter (HOSPITAL_COMMUNITY)
Admission: RE | Admit: 2022-04-17 | Discharge: 2022-04-17 | Disposition: A | Discharge status: Home / Self Care | Payer: BC Managed Care – PPO | Source: Ambulatory Visit | Attending: General Surgery | Admitting: General Surgery

## 2022-04-17 ENCOUNTER — Encounter (HOSPITAL_COMMUNITY): Payer: Self-pay

## 2022-04-17 VITALS — BP 155/95 | HR 64 | Temp 98.3°F | Resp 16 | Ht 71.0 in | Wt 307.0 lb

## 2022-04-17 DIAGNOSIS — K429 Umbilical hernia without obstruction or gangrene: Secondary | ICD-10-CM | POA: Diagnosis present

## 2022-04-17 DIAGNOSIS — R638 Other symptoms and signs concerning food and fluid intake: Secondary | ICD-10-CM

## 2022-04-17 DIAGNOSIS — G4730 Sleep apnea, unspecified: Secondary | ICD-10-CM | POA: Diagnosis not present

## 2022-04-17 DIAGNOSIS — K219 Gastro-esophageal reflux disease without esophagitis: Secondary | ICD-10-CM | POA: Diagnosis not present

## 2022-04-17 DIAGNOSIS — Z6841 Body Mass Index (BMI) 40.0 and over, adult: Secondary | ICD-10-CM | POA: Diagnosis not present

## 2022-04-17 DIAGNOSIS — Z01810 Encounter for preprocedural cardiovascular examination: Secondary | ICD-10-CM | POA: Insufficient documentation

## 2022-04-17 HISTORY — DX: Personal history of urinary calculi: Z87.442

## 2022-04-17 HISTORY — DX: Sleep apnea, unspecified: G47.30

## 2022-04-19 ENCOUNTER — Ambulatory Visit (HOSPITAL_COMMUNITY): Payer: BC Managed Care – PPO | Admitting: Anesthesiology

## 2022-04-19 ENCOUNTER — Ambulatory Visit (HOSPITAL_COMMUNITY)
Admission: RE | Admit: 2022-04-19 | Discharge: 2022-04-19 | Disposition: A | Discharge status: Home / Self Care | Payer: BC Managed Care – PPO | Attending: General Surgery | Admitting: General Surgery

## 2022-04-19 ENCOUNTER — Encounter (HOSPITAL_COMMUNITY): Payer: Self-pay | Admitting: General Surgery

## 2022-04-19 ENCOUNTER — Encounter (HOSPITAL_COMMUNITY)
Admission: RE | Disposition: A | Discharge status: Home / Self Care | Payer: Self-pay | Source: Home / Self Care | Attending: General Surgery

## 2022-04-19 ENCOUNTER — Other Ambulatory Visit: Payer: Self-pay

## 2022-04-19 DIAGNOSIS — Z6841 Body Mass Index (BMI) 40.0 and over, adult: Secondary | ICD-10-CM | POA: Insufficient documentation

## 2022-04-19 DIAGNOSIS — K219 Gastro-esophageal reflux disease without esophagitis: Secondary | ICD-10-CM | POA: Insufficient documentation

## 2022-04-19 DIAGNOSIS — K429 Umbilical hernia without obstruction or gangrene: Secondary | ICD-10-CM | POA: Diagnosis not present

## 2022-04-19 DIAGNOSIS — K420 Umbilical hernia with obstruction, without gangrene: Secondary | ICD-10-CM

## 2022-04-19 DIAGNOSIS — G4730 Sleep apnea, unspecified: Secondary | ICD-10-CM | POA: Insufficient documentation

## 2022-04-19 SURGERY — REPAIR, HERNIA, UMBILICAL, ROBOT-ASSISTED
Anesthesia: General | Site: Abdomen

## 2022-04-19 MED ORDER — BUPIVACAINE HCL (PF) 0.5 % IJ SOLN
INTRAMUSCULAR | Status: AC
  Filled 2022-04-19: qty 30

## 2022-04-19 MED ORDER — SUGAMMADEX SODIUM 200 MG/2ML IV SOLN
INTRAVENOUS | Status: DC | PRN
  Administered 2022-04-19: 400 mg via INTRAVENOUS

## 2022-04-19 MED ORDER — LIDOCAINE 2% (20 MG/ML) 5 ML SYRINGE
INTRAMUSCULAR | Status: DC | PRN
  Administered 2022-04-19: 100 mg via INTRAVENOUS

## 2022-04-19 MED ORDER — MEPERIDINE HCL 50 MG/ML IJ SOLN: 6.2500 mg | INTRAMUSCULAR | Status: DC | PRN

## 2022-04-19 MED ORDER — MIDAZOLAM HCL 2 MG/2ML IJ SOLN
INTRAMUSCULAR | Status: AC
  Filled 2022-04-19: qty 2

## 2022-04-19 MED ORDER — DEXAMETHASONE SODIUM PHOSPHATE 10 MG/ML IJ SOLN
INTRAMUSCULAR | Status: AC
  Filled 2022-04-19: qty 1

## 2022-04-19 MED ORDER — KETOROLAC TROMETHAMINE 30 MG/ML IJ SOLN
30.0000 mg | Freq: Once | INTRAMUSCULAR | Status: AC
  Administered 2022-04-19: 30 mg via INTRAVENOUS
  Filled 2022-04-19: qty 1

## 2022-04-19 MED ORDER — KETOROLAC TROMETHAMINE 30 MG/ML IJ SOLN
INTRAMUSCULAR | Status: AC
  Filled 2022-04-19: qty 1

## 2022-04-19 MED ORDER — METHOCARBAMOL 500 MG PO TABS: 750.0000 mg | ORAL_TABLET | Freq: Four times a day (QID) | ORAL | 0 refills | Status: DC | PRN

## 2022-04-19 MED ORDER — ONDANSETRON HCL 4 MG/2ML IJ SOLN
INTRAMUSCULAR | Status: DC | PRN
  Administered 2022-04-19: 4 mg via INTRAVENOUS

## 2022-04-19 MED ORDER — HYDROMORPHONE HCL 1 MG/ML IJ SOLN
0.2500 mg | INTRAMUSCULAR | Status: DC | PRN
  Administered 2022-04-19 (×2): 0.5 mg via INTRAVENOUS
  Filled 2022-04-19 (×2): qty 0.5

## 2022-04-19 MED ORDER — ROCURONIUM BROMIDE 50 MG/5ML IV SOSY
PREFILLED_SYRINGE | INTRAVENOUS | Status: DC | PRN
  Administered 2022-04-19: 70 mg via INTRAVENOUS
  Administered 2022-04-19: 30 mg via INTRAVENOUS

## 2022-04-19 MED ORDER — ONDANSETRON HCL 4 MG/2ML IJ SOLN: 4.0000 mg | Freq: Once | INTRAMUSCULAR | Status: DC | PRN

## 2022-04-19 MED ORDER — DEXAMETHASONE SODIUM PHOSPHATE 10 MG/ML IJ SOLN
INTRAMUSCULAR | Status: DC | PRN
  Administered 2022-04-19: 10 mg via INTRAVENOUS

## 2022-04-19 MED ORDER — PROPOFOL 10 MG/ML IV BOLUS
INTRAVENOUS | Status: AC
  Filled 2022-04-19: qty 20

## 2022-04-19 MED ORDER — ORAL CARE MOUTH RINSE: 15.0000 mL | Freq: Once | OROMUCOSAL | Status: AC

## 2022-04-19 MED ORDER — LACTATED RINGERS IV SOLN: INTRAVENOUS | Status: DC

## 2022-04-19 MED ORDER — CHLORHEXIDINE GLUCONATE 0.12 % MT SOLN
15.0000 mL | Freq: Once | OROMUCOSAL | Status: AC
  Administered 2022-04-19: 15 mL via OROMUCOSAL

## 2022-04-19 MED ORDER — MIDAZOLAM HCL 5 MG/5ML IJ SOLN
INTRAMUSCULAR | Status: DC | PRN
  Administered 2022-04-19: 2 mg via INTRAVENOUS

## 2022-04-19 MED ORDER — OXYCODONE HCL 5 MG PO TABS: 5.0000 mg | ORAL_TABLET | ORAL | 0 refills | Status: DC | PRN

## 2022-04-19 MED ORDER — SUCCINYLCHOLINE CHLORIDE 200 MG/10ML IV SOSY
PREFILLED_SYRINGE | INTRAVENOUS | Status: DC | PRN
  Administered 2022-04-19: 120 mg via INTRAVENOUS

## 2022-04-19 MED ORDER — CHLORHEXIDINE GLUCONATE CLOTH 2 % EX PADS: 6.0000 | MEDICATED_PAD | Freq: Once | CUTANEOUS | Status: DC

## 2022-04-19 MED ORDER — FENTANYL CITRATE (PF) 100 MCG/2ML IJ SOLN
INTRAMUSCULAR | Status: AC
  Filled 2022-04-19: qty 2

## 2022-04-19 MED ORDER — ONDANSETRON HCL 4 MG/2ML IJ SOLN
INTRAMUSCULAR | Status: AC
  Filled 2022-04-19: qty 2

## 2022-04-19 MED ORDER — BUPIVACAINE HCL (PF) 0.5 % IJ SOLN
INTRAMUSCULAR | Status: DC | PRN
  Administered 2022-04-19: 22 mL

## 2022-04-19 MED ORDER — PHENYLEPHRINE 80 MCG/ML (10ML) SYRINGE FOR IV PUSH (FOR BLOOD PRESSURE SUPPORT)
PREFILLED_SYRINGE | INTRAVENOUS | Status: DC | PRN
  Administered 2022-04-19: 160 ug via INTRAVENOUS
  Administered 2022-04-19: 80 ug via INTRAVENOUS

## 2022-04-19 MED ORDER — FENTANYL CITRATE (PF) 100 MCG/2ML IJ SOLN
INTRAMUSCULAR | Status: DC | PRN
  Administered 2022-04-19: 100 ug via INTRAVENOUS
  Administered 2022-04-19 (×2): 50 ug via INTRAVENOUS

## 2022-04-19 MED ORDER — PROPOFOL 10 MG/ML IV BOLUS
INTRAVENOUS | Status: DC | PRN
  Administered 2022-04-19: 150 mg via INTRAVENOUS

## 2022-04-19 MED ORDER — PHENYLEPHRINE 80 MCG/ML (10ML) SYRINGE FOR IV PUSH (FOR BLOOD PRESSURE SUPPORT)
PREFILLED_SYRINGE | INTRAVENOUS | Status: AC
  Filled 2022-04-19: qty 10

## 2022-04-19 MED ORDER — LIDOCAINE HCL (PF) 2 % IJ SOLN
INTRAMUSCULAR | Status: AC
  Filled 2022-04-19: qty 5

## 2022-04-19 MED ORDER — STERILE WATER FOR IRRIGATION IR SOLN
Status: DC | PRN
  Administered 2022-04-19: 500 mL

## 2022-04-19 MED ORDER — ROCURONIUM BROMIDE 10 MG/ML (PF) SYRINGE
PREFILLED_SYRINGE | INTRAVENOUS | Status: AC
  Filled 2022-04-19: qty 10

## 2022-04-19 MED ORDER — CEFAZOLIN IN SODIUM CHLORIDE 3-0.9 GM/100ML-% IV SOLN
3.0000 g | INTRAVENOUS | Status: AC
  Administered 2022-04-19: 3 g via INTRAVENOUS
  Filled 2022-04-19: qty 100

## 2022-04-19 MED ORDER — PHENYLEPHRINE HCL-NACL 20-0.9 MG/250ML-% IV SOLN
INTRAVENOUS | Status: DC | PRN
  Administered 2022-04-19: 25 ug/min via INTRAVENOUS

## 2022-04-19 SURGICAL SUPPLY — 42 items
ADH SKN CLS APL DERMABOND .7 (GAUZE/BANDAGES/DRESSINGS) ×1
APL PRP STRL LF DISP 70% ISPRP (MISCELLANEOUS) ×1
CHLORAPREP W/TINT 26 (MISCELLANEOUS) ×1 IMPLANT
COVER MAYO STAND XLG (MISCELLANEOUS) ×1 IMPLANT
COVER TIP SHEARS 8 DVNC (MISCELLANEOUS) ×1 IMPLANT
COVER TIP SHEARS 8MM DA VINCI (MISCELLANEOUS) ×1
DEFOGGER SCOPE WARMER CLEARIFY (MISCELLANEOUS) IMPLANT
DERMABOND ADVANCED .7 DNX12 (GAUZE/BANDAGES/DRESSINGS) ×1 IMPLANT
DRAPE ARM DVNC X/XI (DISPOSABLE) ×3 IMPLANT
DRAPE COLUMN DVNC XI (DISPOSABLE) ×1 IMPLANT
DRAPE DA VINCI XI ARM (DISPOSABLE) ×3
DRAPE DA VINCI XI COLUMN (DISPOSABLE) ×1
DRSG TEGADERM 2-3/8X2-3/4 SM (GAUZE/BANDAGES/DRESSINGS) IMPLANT
GAUZE SPONGE 2X2 STRL 8-PLY (GAUZE/BANDAGES/DRESSINGS) IMPLANT
GLOVE BIO SURGEON STRL SZ7 (GLOVE) IMPLANT
GLOVE BIOGEL PI IND STRL 7.0 (GLOVE) ×3 IMPLANT
GLOVE SURG SS PI 7.5 STRL IVOR (GLOVE) ×2 IMPLANT
GOWN STRL REUS W/TWL LRG LVL3 (GOWN DISPOSABLE) ×3 IMPLANT
MANIFOLD NEPTUNE II (INSTRUMENTS) ×1 IMPLANT
MESH VENTRALIGHT ST 4.5IN (Mesh General) IMPLANT
NDL HYPO 21X1.5 SAFETY (NEEDLE) ×1 IMPLANT
NDL INSUFFLATION 14GA 120MM (NEEDLE) ×1 IMPLANT
NEEDLE HYPO 21X1.5 SAFETY (NEEDLE) ×1 IMPLANT
NEEDLE INSUFFLATION 14GA 120MM (NEEDLE) ×1 IMPLANT
OBTURATOR OPTICAL STANDARD 8MM (TROCAR) ×1
OBTURATOR OPTICAL STND 8 DVNC (TROCAR) ×1
OBTURATOR OPTICALSTD 8 DVNC (TROCAR) ×1 IMPLANT
PACK LAP CHOLECYSTECTOMY (MISCELLANEOUS) ×1 IMPLANT
PENCIL HANDSWITCHING (ELECTRODE) ×1 IMPLANT
SEAL CANN UNIV 5-8 DVNC XI (MISCELLANEOUS) ×3 IMPLANT
SEAL XI 5MM-8MM UNIVERSAL (MISCELLANEOUS) ×3
SET TUBE SMOKE EVAC HIGH FLOW (TUBING) ×1 IMPLANT
SUT MNCRL AB 4-0 PS2 18 (SUTURE) ×2 IMPLANT
SUT STRATAFIX 0 PDS+ CT-2 23 (SUTURE) ×1
SUT V-LOC 90 ABS 3-0 VLT  V-20 (SUTURE) ×3
SUT V-LOC 90 ABS 3-0 VLT V-20 (SUTURE) ×3 IMPLANT
SUTURE STRATFX 0 PDS+ CT-2 23 (SUTURE) ×1 IMPLANT
SYR 30ML LL (SYRINGE) IMPLANT
TAPE TRANSPORE STRL 2 31045 (GAUZE/BANDAGES/DRESSINGS) ×1 IMPLANT
TRAY FOLEY W/BAG SLVR 16FR (SET/KITS/TRAYS/PACK) ×1
TRAY FOLEY W/BAG SLVR 16FR ST (SET/KITS/TRAYS/PACK) ×1 IMPLANT
WATER STERILE IRR 500ML POUR (IV SOLUTION) ×1 IMPLANT

## 2022-04-19 NOTE — Transfer of Care (Signed)
Immediate Anesthesia Transfer of Care Note  Patient: Jack Long  Procedure(s) Performed: XI ROBOT ASSISTED UMBILICAL HERNIA REPAIR W/ MESH (Abdomen)  Patient Location: PACU  Anesthesia Type:General  Level of Consciousness: drowsy and patient cooperative  Airway & Oxygen Therapy: Patient Spontanous Breathing and Patient connected to face mask oxygen  Post-op Assessment: Report given to RN and Post -op Vital signs reviewed and stable  Post vital signs: Reviewed and stable  Last Vitals:  Vitals Value Taken Time  BP 149/70 04/19/22 1240  Temp    Pulse 78 04/19/22 1243  Resp 10 04/19/22 1242  SpO2 95 % 04/19/22 1243  Vitals shown include unvalidated device data.  Last Pain:  Vitals:   04/19/22 0924  TempSrc: Oral  PainSc: 0-No pain      Patients Stated Pain Goal: 5 ([id number])  Complications: No notable events documented.

## 2022-04-19 NOTE — Anesthesia Procedure Notes (Signed)
Procedure Name: Intubation Date/Time: 04/19/2022 10:43 AM  Performed by: Renato Shin, CRNAPre-anesthesia Checklist: Patient identified, Emergency Drugs available, Suction available and Patient being monitored Patient Re-evaluated:Patient Re-evaluated prior to induction Oxygen Delivery Method: Circle system utilized Preoxygenation: Pre-oxygenation with 100% oxygen Induction Type: IV induction and Rapid sequence Ventilation: Mask ventilation without difficulty Laryngoscope Size: Miller and 3 Grade View: Grade II Tube type: Oral Tube size: 7.5 mm Number of attempts: 1 Airway Equipment and Method: Stylet Placement Confirmation: ETT inserted through vocal cords under direct vision, positive ETCO2 and breath sounds checked- equal and bilateral Secured at: 22 cm Tube secured with: Tape Dental Injury: Teeth and Oropharynx as per pre-operative assessment

## 2022-04-19 NOTE — Anesthesia Preprocedure Evaluation (Addendum)
Anesthesia Evaluation  Patient identified by MRN, date of birth, ID band Patient awake    Reviewed: Allergy & Precautions, H&P , NPO status , Patient's Chart, lab work & pertinent test results  Airway Mallampati: III  TM Distance: >3 FB Neck ROM: Full    Dental  (+) Dental Advisory Given, Missing   Pulmonary sleep apnea and Continuous Positive Airway Pressure Ventilation , pneumonia, resolved   Pulmonary exam normal breath sounds clear to auscultation       Cardiovascular negative cardio ROS Normal cardiovascular exam Rhythm:Regular Rate:Normal     Neuro/Psych negative neurological ROS  negative psych ROS   GI/Hepatic Neg liver ROS,GERD  Controlled,,  Endo/Other    Morbid obesity  Renal/GU negative Renal ROS  negative genitourinary   Musculoskeletal negative musculoskeletal ROS (+)    Abdominal   Peds negative pediatric ROS (+)  Hematology negative hematology ROS (+)   Anesthesia Other Findings   Reproductive/Obstetrics negative OB ROS                             Anesthesia Physical Anesthesia Plan  ASA: 3  Anesthesia Plan: General   Post-op Pain Management: Dilaudid IV   Induction: Intravenous  PONV Risk Score and Plan: 3 and Ondansetron and Dexamethasone  Airway Management Planned: Oral ETT and Video Laryngoscope Planned  Additional Equipment:   Intra-op Plan:   Post-operative Plan:   Informed Consent: I have reviewed the patients History and Physical, chart, labs and discussed the procedure including the risks, benefits and alternatives for the proposed anesthesia with the patient or authorized representative who has indicated his/her understanding and acceptance.     Dental advisory given  Plan Discussed with: CRNA and Surgeon  Anesthesia Plan Comments:        Anesthesia Quick Evaluation

## 2022-04-19 NOTE — Op Note (Signed)
Patient:  Jack Long  DOB:  [birth year]  MRN:  [id number]   Preop Diagnosis: Umbilical hernia  Postop Diagnosis: Same, incarcerated  Procedure: Robotic assisted laparoscopic umbilical herniorrhaphy with mesh, incarcerated  Surgeon: Aviva Signs, MD  Anes: General endotracheal  Indications: Patient is a 52 year old white male who presents with a 6 cm umbilical hernia.  I can only partially reduce it.  Omentum seems to be adhered in the hernia sac.  The risks and benefits of the procedure including bleeding, infection, and the possibility of recurrence of the hernia were fully explained to the patient, who gave informed consent.  Procedure note: The patient was placed in the supine position.  After induction of general endotracheal anesthesia, the abdomen was prepped and draped using usual sterile technique with ChloraPrep.  Surgical site confirmation was performed.  A Veress needle was introduced into the left upper quadrant without difficulty.  Confirmation of placement was done using the saline drop test.  The abdomen was then insufflated to 15 mmHg pressure.  An 8 mm trocar was introduced into the abdominal cavity under direct visualization without difficulty.  An 8 mm trocar was then introduced into the left flank and left lower quadrant regions under direct visualization.  The robot was then targeted and docked.  The patient had a significant amount of omentum adhered to the hernia sac within the umbilical defect.  The hernia sac along with the omental contents were freed away using sharp dissection and cautery.  Once this was performed, the defect was closed longitudinally using an 0 Stratafix running suture.  An 11 cm Bard Ventralight dual mesh was then inserted and secured to the peritoneum circumferentially using a 3-0 V-Loc running suture.  A tension-free repair was performed.  All air was then removed from the abdominal cavity prior to removal of the trocars.  All wounds were  irrigated normal saline.  All wounds were injected with 0.5% Sensorcaine.  All incisions were closed using a 4-0 Monocryl subcuticular suture.  Dermabond was applied.  All tape and needle counts were correct at the end of the procedure.  The patient was extubated in the operating room and transferred to PACU in stable condition.  Complications: None  EBL: Minimal  Specimen: None

## 2022-04-19 NOTE — Anesthesia Postprocedure Evaluation (Signed)
Anesthesia Post Note  Patient: Jack Long  Procedure(s) Performed: XI ROBOT ASSISTED UMBILICAL HERNIA REPAIR W/ MESH (Abdomen)  Patient location during evaluation: Phase II Anesthesia Type: General Level of consciousness: awake and alert and oriented Pain management: pain level controlled Vital Signs Assessment: post-procedure vital signs reviewed and stable Respiratory status: spontaneous breathing, nonlabored ventilation and respiratory function stable Cardiovascular status: blood pressure returned to baseline and stable Postop Assessment: no apparent nausea or vomiting Anesthetic complications: no  No notable events documented.   Last Vitals:  Vitals:   04/19/22 1330 04/19/22 1407  BP: 94/71 (!) 153/96  Pulse: 66 67  Resp: 16 16  Temp:  36.5 C  SpO2: 99% 92%    Last Pain:  Vitals:   04/19/22 1407  TempSrc: Oral  PainSc: 9                  [name] C Jeffrey Graefe

## 2022-04-19 NOTE — Interval H&P Note (Signed)
History and Physical Interval Note:  04/19/2022 9:12 AM  Jack Long  has presented today for surgery, with the diagnosis of HERNIA, UMBILICAL, 6 CM.  The various methods of treatment have been discussed with the patient and family. After consideration of risks, benefits and other options for treatment, the patient has consented to  Procedure(s): XI Irion (N/A) as a surgical intervention.  The patient's history has been reviewed, patient examined, no change in status, stable for surgery.  I have reviewed the patient's chart and labs.  Questions were answered to the patient's satisfaction.     Aviva Signs

## 2022-04-24 ENCOUNTER — Telehealth: Payer: Self-pay | Admitting: Family Medicine

## 2022-04-24 NOTE — Telephone Encounter (Signed)
[employer] (STD) paperwork completed and faxed to [phone] on 04/23/2022.  Attn: Lorita Officer. Confirmation received.    Out of work starting 04/19/2022 and may return unrestricted on 06-04-2022

## 2022-05-03 ENCOUNTER — Encounter: Payer: Self-pay | Admitting: *Deleted

## 2022-05-03 ENCOUNTER — Encounter: Payer: Self-pay | Admitting: General Surgery

## 2022-05-03 ENCOUNTER — Ambulatory Visit (INDEPENDENT_AMBULATORY_CARE_PROVIDER_SITE_OTHER): Payer: BC Managed Care – PPO | Admitting: General Surgery

## 2022-05-03 VITALS — BP 146/95 | HR 64 | Temp 98.2°F | Resp 16 | Ht 71.0 in | Wt 307.0 lb

## 2022-05-03 DIAGNOSIS — K429 Umbilical hernia without obstruction or gangrene: Secondary | ICD-10-CM

## 2022-05-03 NOTE — Progress Notes (Signed)
Subjective:     Jack Long  Patient here for postoperative visit, status post robotic assisted laparoscopic umbilical herniorrhaphy with mesh.  Patient is doing very well.  He has some right lower quadrant abdominal wall pain, but this is not intense.  He is gradually increasing his activity. Objective:    BP (!) 146/95   Pulse 64   Temp 98.2 F (36.8 C) (Oral)   Resp 16   Ht 5\' 11"  (1.803 m)   Wt (!) 307 lb (139.3 kg)   SpO2 96%   BMI 42.82 kg/m   General:  alert, cooperative, and no distress  Abdomen is soft, incisions healing well.  Umbilical hernia resolved.  No seroma or hematoma present.  No particular findings in right lower portion of abdomen.     Assessment:    Doing well postoperatively. Possible muscle strain from surgery.    Plan:   Patient may continue to increase activity as able.  He is able to return to work without restrictions on 05/21/2022.  Follow-up here as needed.

## 2023-02-08 ENCOUNTER — Emergency Department (HOSPITAL_COMMUNITY)
Admission: EM | Admit: 2023-02-08 | Discharge: 2023-02-08 | Disposition: A | Discharge status: Home / Self Care | Payer: BC Managed Care – PPO | Attending: Emergency Medicine | Admitting: Emergency Medicine

## 2023-02-08 ENCOUNTER — Other Ambulatory Visit: Payer: Self-pay

## 2023-02-08 ENCOUNTER — Encounter (HOSPITAL_COMMUNITY): Payer: Self-pay

## 2023-02-08 DIAGNOSIS — I10 Essential (primary) hypertension: Secondary | ICD-10-CM | POA: Insufficient documentation

## 2023-02-08 DIAGNOSIS — R030 Elevated blood-pressure reading, without diagnosis of hypertension: Secondary | ICD-10-CM

## 2023-02-08 DIAGNOSIS — R739 Hyperglycemia, unspecified: Secondary | ICD-10-CM | POA: Diagnosis not present

## 2023-02-08 DIAGNOSIS — R944 Abnormal results of kidney function studies: Secondary | ICD-10-CM | POA: Insufficient documentation

## 2023-02-08 DIAGNOSIS — R7309 Other abnormal glucose: Secondary | ICD-10-CM

## 2023-02-08 LAB — BASIC METABOLIC PANEL
Anion gap: 8 (ref 5–15)
BUN: 15 mg/dL (ref 6–20)
CO2: 23 mmol/L (ref 22–32)
Calcium: 8.5 mg/dL — ABNORMAL LOW (ref 8.9–10.3)
Chloride: 104 mmol/L (ref 98–111)
Creatinine, Ser: 1.27 mg/dL — ABNORMAL HIGH (ref 0.61–1.24)
GFR, Estimated: >60 mL/min (ref >60)
Glucose, Bld: 201 mg/dL — ABNORMAL HIGH (ref 70–99)
Potassium: 3.5 mmol/L (ref 3.5–5.1)
Sodium: 135 mmol/L (ref 135–145)

## 2023-02-08 LAB — CBC WITH DIFFERENTIAL/PLATELET
Abs Immature Granulocytes: 0.02 10*3/uL (ref 0.00–0.07)
Basophils Absolute: 0 10*3/uL (ref 0.0–0.1)
Basophils Relative: 0 %
Eosinophils Absolute: 0.1 10*3/uL (ref 0.0–0.5)
Eosinophils Relative: 1 %
HCT: 45.8 % (ref 39.0–52.0)
Hemoglobin: 14.9 g/dL (ref 13.0–17.0)
Immature Granulocytes: 0 %
Lymphocytes Relative: 30 %
Lymphs Abs: 2.1 10*3/uL (ref 0.7–4.0)
MCH: 28.8 pg (ref 26.0–34.0)
MCHC: 32.5 g/dL (ref 30.0–36.0)
MCV: 88.6 fL (ref 80.0–100.0)
Monocytes Absolute: 0.4 10*3/uL (ref 0.1–1.0)
Monocytes Relative: 5 %
Neutro Abs: 4.6 10*3/uL (ref 1.7–7.7)
Neutrophils Relative %: 64 %
Platelets: 148 10*3/uL — ABNORMAL LOW (ref 150–400)
RBC: 5.17 MIL/uL (ref 4.22–5.81)
RDW: 13.2 % (ref 11.5–15.5)
WBC: 7.1 10*3/uL (ref 4.0–10.5)
nRBC: 0 % (ref 0.0–0.2)

## 2023-02-08 NOTE — ED Provider Notes (Signed)
[place] EMERGENCY DEPARTMENT AT [facility] Provider Note   CSN: [id number] Arrival date & time: 02/08/23  1449     History  Chief Complaint  Patient presents with   Hypertension    Jack Long is a 53 y.o. male with no significant past medical history presenting for evaluation of blood pressure problems.  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  He was seen by his provider today for a steroid injection in his right thumb, he suspects he has a trigger finger.  However blood pressure obtained in his left arm was significantly elevated in the 160 range.  When she checked his blood pressure in his right arm it was 120 systolic.  She was concerned about this variance and sent him here for further evaluation.  He denies chest pain, shortness of breath, peripheral edema, orthopnea Or other complaints.     The history is provided by the patient.       Home Medications Prior to Admission medications   Not on File      Allergies    Patient has no known allergies.    Review of Systems   Review of Systems  Constitutional:  Negative for fever.  HENT:  Negative for congestion and sore throat.   Eyes: Negative.   Respiratory:  Negative for chest tightness and shortness of breath.   Cardiovascular:  Negative for chest pain.  Gastrointestinal:  Negative for abdominal pain and nausea.  Genitourinary: Negative.   Musculoskeletal:  Positive for arthralgias. Negative for joint swelling and neck pain.  Skin: Negative.  Negative for rash and wound.  Neurological:  Negative for dizziness, weakness, light-headedness, numbness and headaches.  Psychiatric/Behavioral: Negative.    All other systems reviewed and are negative.   Physical Exam Updated Vital Signs BP (!) 160/86 (BP Location: Left Arm)   Pulse 71   Temp 98.6 F (37 C) (Oral)   Resp 16   Ht 5\' 10"  (1.778 m)   Wt 133.8  kg   SpO2 98%   BMI 42.33 kg/m  Physical Exam Vitals and nursing note reviewed.  Constitutional:      Appearance: He is well-developed.  HENT:     Head: Normocephalic and atraumatic.  Eyes:     Conjunctiva/sclera: Conjunctivae normal.  Cardiovascular:     Rate and Rhythm: Normal rate and regular rhythm.     Pulses: Normal pulses.     Heart sounds: Normal heart sounds.     Comments: Radial pulses equal bilaterally.  No peripheral edema.  No jvd. Pulmonary:     Effort: Pulmonary effort is normal.     Breath sounds: Normal breath sounds. No wheezing.  Abdominal:     General: Bowel sounds are normal.     Palpations: Abdomen is soft.     Tenderness: There is no abdominal tenderness.  Musculoskeletal:        General: Normal range of motion.     Cervical back: Normal range of motion.  Skin:    General: Skin is warm and dry.  Neurological:     Mental Status: He is alert.     ED Results / Procedures / Treatments   Labs (all labs ordered are listed, but only abnormal results are displayed) Labs Reviewed  CBC WITH DIFFERENTIAL/PLATELET - Abnormal; Notable for the following components:      Result Value   Platelets 148 (*)  All other components within normal limits  BASIC METABOLIC PANEL - Abnormal; Notable for the following components:   Glucose, Bld 201 (*)    Creatinine, Ser 1.27 (*)    Calcium 8.5 (*)    All other components within normal limits    EKG None  Radiology No results found.  Procedures Procedures    Medications Ordered in ED Medications - No data to display  ED Course/ Medical Decision Making/ A&P                                 Medical Decision Making Pt presenting with possible concern of discrepant bp's in upper extremities per primary provider.  Rechecked here, and no significant abnormalities - manual bp readings 160/86 left arm,  158/88 right arm.  No additional workup required.  Amount and/or Complexity of Data Reviewed Labs:  ordered.    Details: Labs reviewed,  creatinine elevated at 1.27,  stable.  He is hyperglycemic today with glucose 201.   Risk Decision regarding hospitalization. Risk Details: No indication for admission or further ed testing.           Final Clinical Impression(s) / ED Diagnoses Final diagnoses:  Elevated blood pressure reading  Elevated glucose level    Rx / DC Orders ED Discharge Orders     None         Victoriano Lain 02/08/23 1858    Terrilee Files, MD 02/09/23 1049

## 2023-02-08 NOTE — ED Triage Notes (Signed)
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 Pt stated that he doesn't feel bad other than "being a little tired"

## 2023-02-08 NOTE — Discharge Instructions (Signed)
Your blood pressure readings in your arms are equal bilaterally, much more today little bit elevated at 160/86 and 158/88, possibly secondary to your ED visit itself.  Your blood work is reassuring except that your blood glucose level is elevated right now at 201.  A fasting blood glucose level should be less than 110.  I do recommend repeating this test in the morning before you have had anything to eat or drink to make sure you are blood glucose is truly normal range.  Dr. Neita Carp for an office visit for this test.
